# Patient Record
Sex: Female | Born: 1995 | Race: Black or African American | Hispanic: No | Marital: Single | Smoking: Never smoker
Health system: Southern US, Community
[De-identification: ages and names within clinical notes are randomized; demographics above are authoritative.]

## PROBLEM LIST (undated history)

## (undated) HISTORY — PX: TONSILLECTOMY: SUR1361

---

## 2016-07-11 ENCOUNTER — Emergency Department (HOSPITAL_COMMUNITY)
Admission: EM | Admit: 2016-07-11 | Discharge: 2016-07-12 | Disposition: A | Discharge status: Home / Self Care | Payer: Self-pay | Attending: Emergency Medicine | Admitting: Emergency Medicine

## 2016-07-11 DIAGNOSIS — T7840XA Allergy, unspecified, initial encounter: Secondary | ICD-10-CM | POA: Insufficient documentation

## 2016-07-11 DIAGNOSIS — Z5321 Procedure and treatment not carried out due to patient leaving prior to being seen by health care provider: Secondary | ICD-10-CM | POA: Insufficient documentation

## 2016-07-12 ENCOUNTER — Encounter (HOSPITAL_COMMUNITY): Payer: Self-pay | Admitting: Nurse Practitioner

## 2016-07-12 NOTE — ED Notes (Signed)
Pt presents with mild general hives that she reports she does not know what may have triggered it. Denies any other symptoms

## 2018-05-30 ENCOUNTER — Ambulatory Visit: Payer: Self-pay

## 2018-05-30 ENCOUNTER — Other Ambulatory Visit: Payer: Self-pay | Admitting: Occupational Medicine

## 2018-05-30 DIAGNOSIS — S0031XA Abrasion of nose, initial encounter: Secondary | ICD-10-CM

## 2019-07-28 IMAGING — DX DG NASAL BONES 3+V
3 series · 3 of 3 positions shown · non-contrast
Comparison: None.

CLINICAL DATA: Blunt trauma to the nose yesterday.

EXAM:
NASAL BONES - 3+ VIEW

[skull waters]
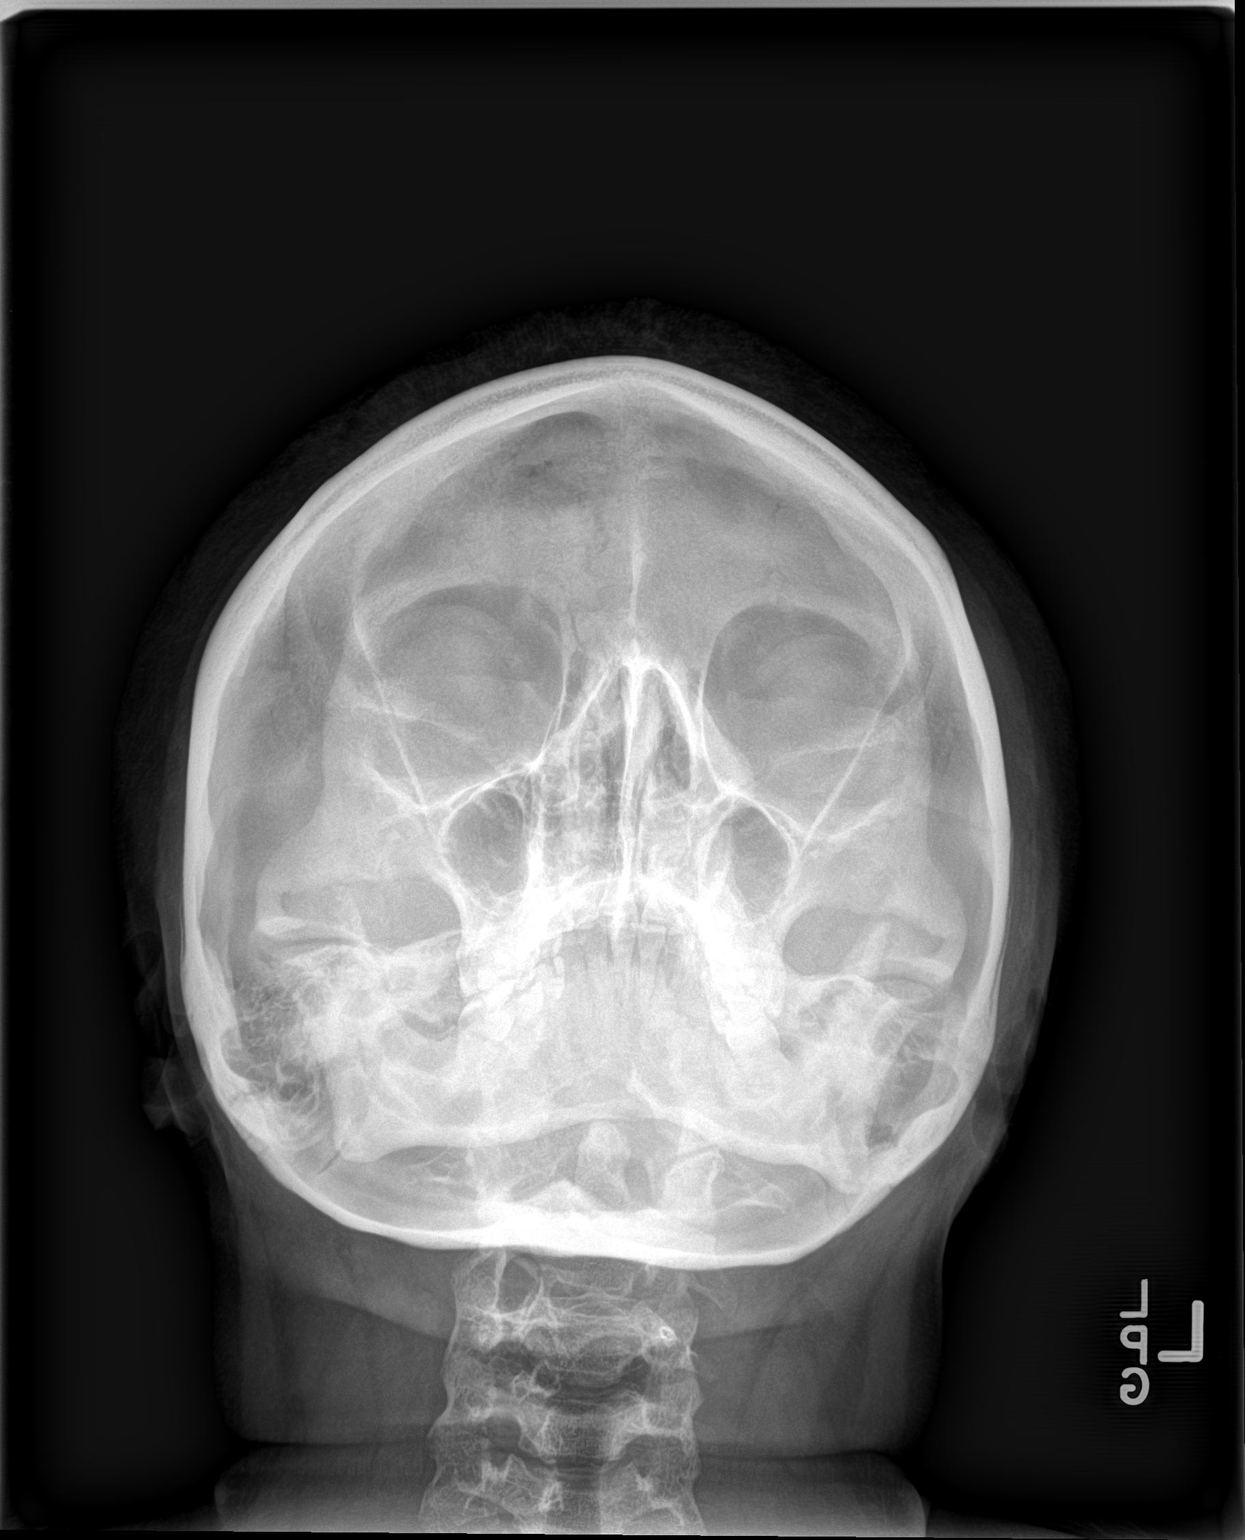

[nasal lat (1 of 2)]
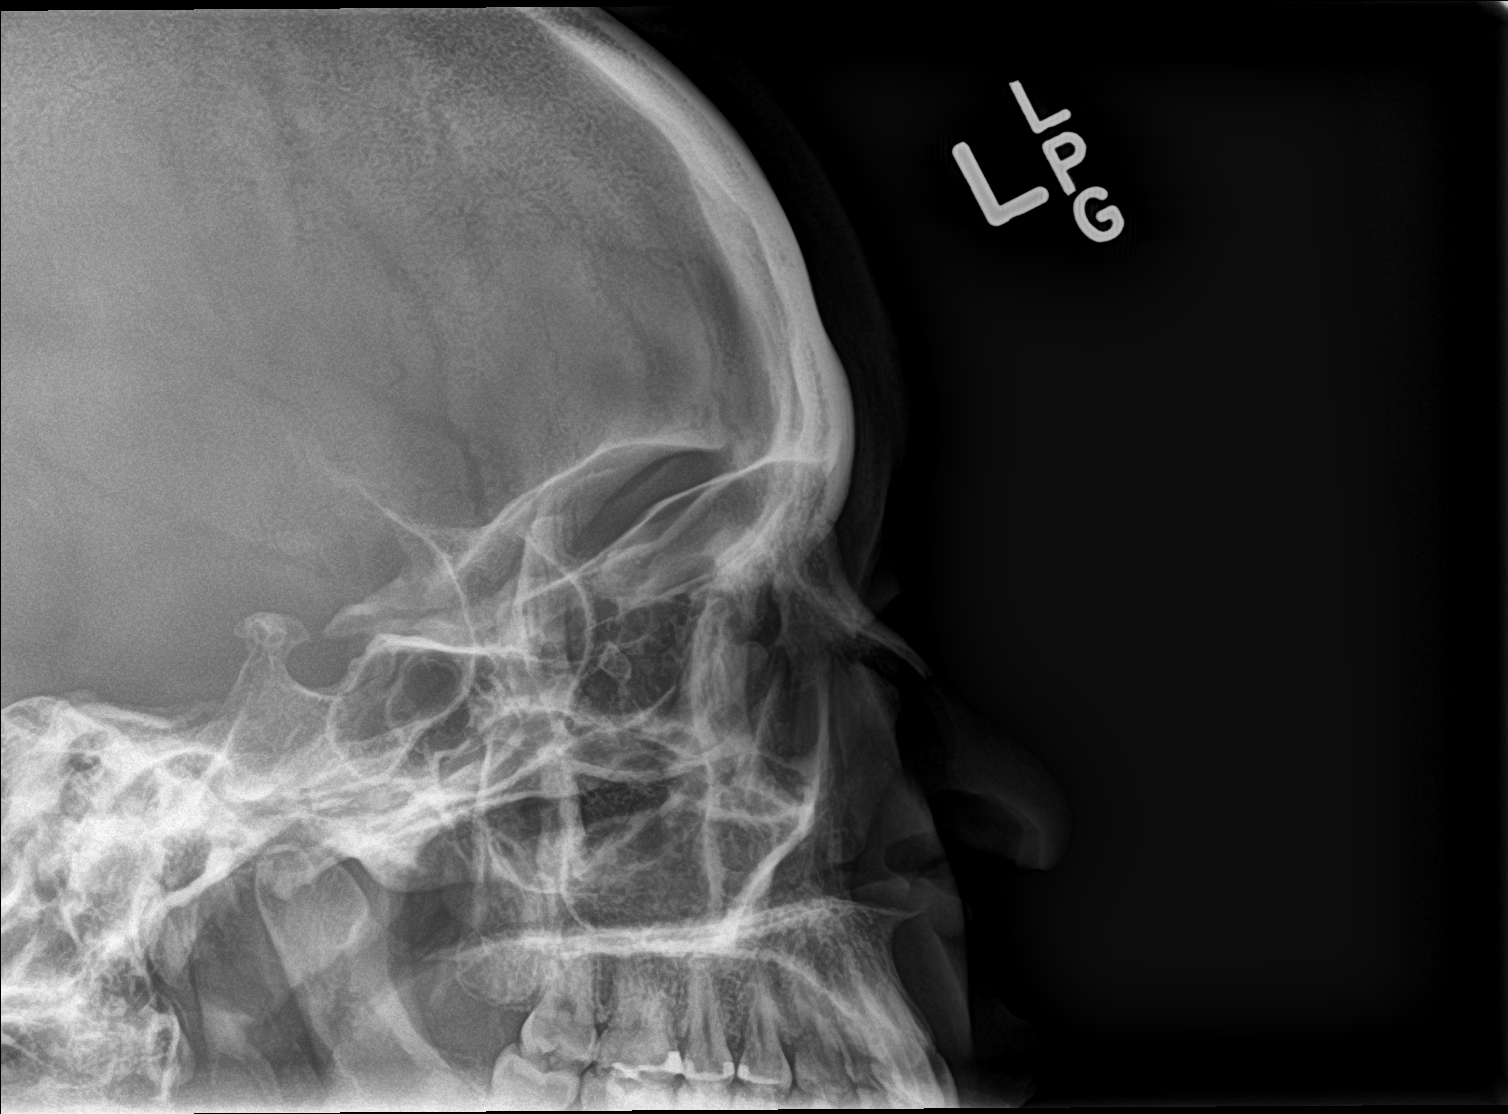

[nasal lat (2 of 2)]
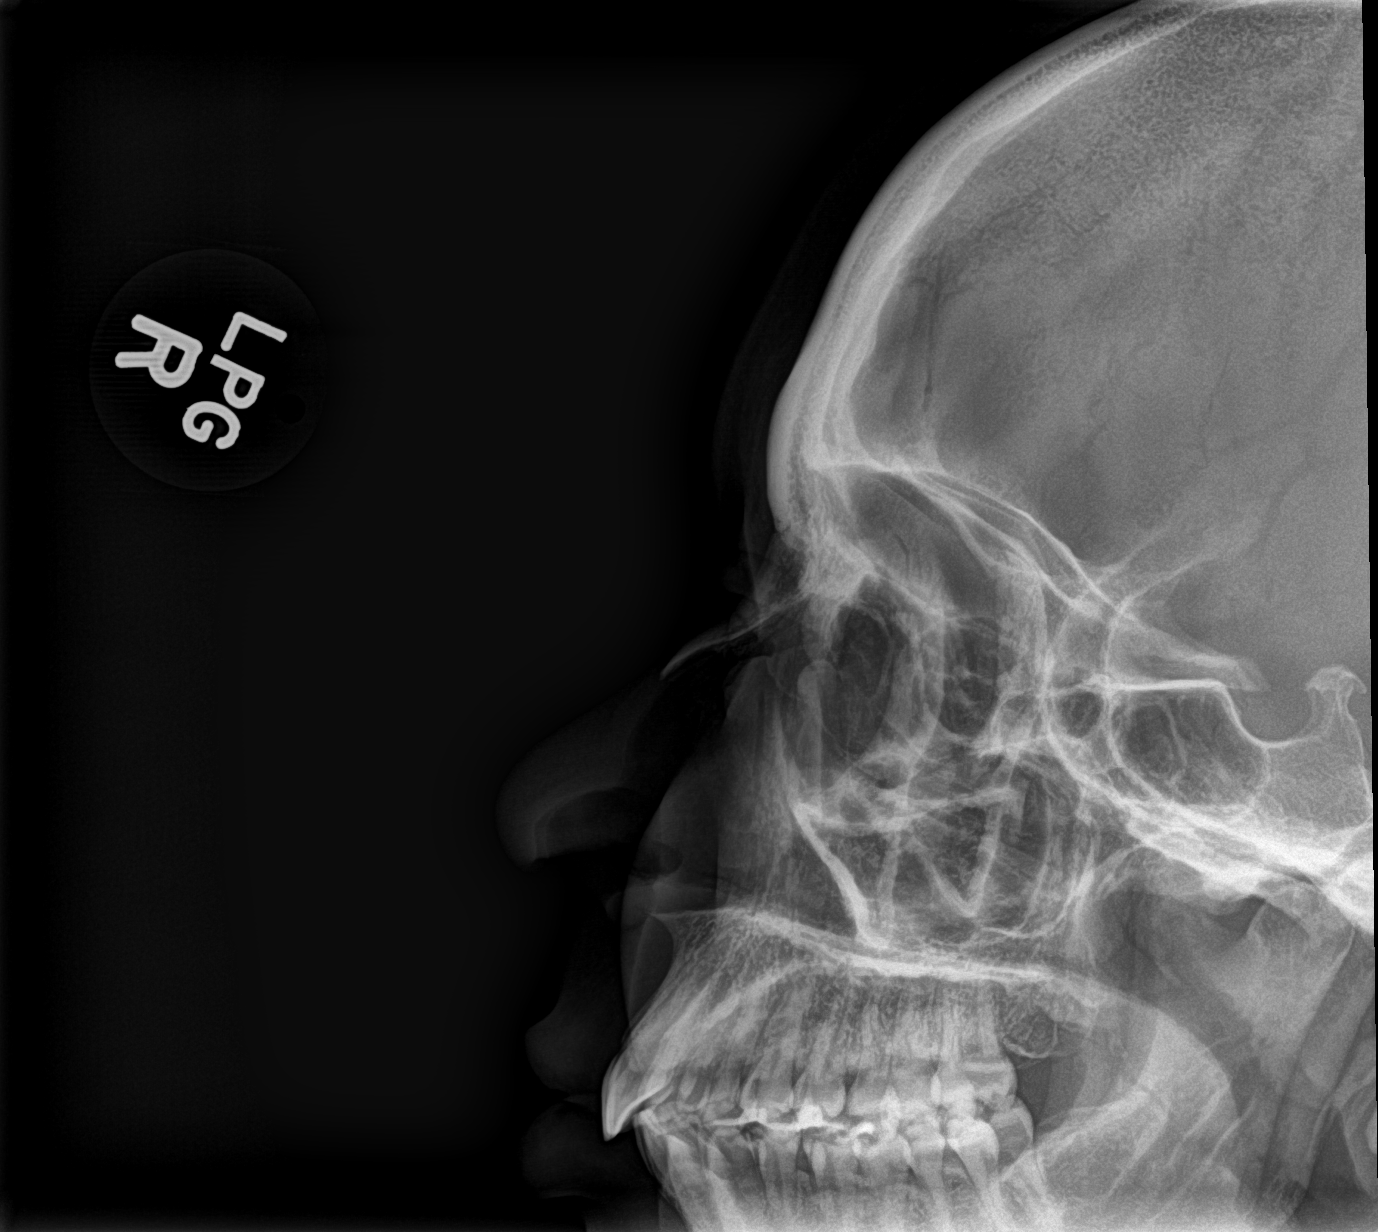

[3 of 3 positions shown; findings below may reference images not displayed]

FINDINGS: No evidence of fracture of the nasal bones or nasal spine of the
maxilla. No fluid in the maxillary sinuses. Frontal sinuses are
aplastic.
IMPRESSION: No traumatic finding.
